# Patient Record
Sex: Male | Born: 1955 | Race: White | Hispanic: No | Marital: Married
Health system: Southern US, Community
[De-identification: ages and names within clinical notes are randomized; demographics above are authoritative.]

---

## 1999-05-11 ENCOUNTER — Other Ambulatory Visit: Admission: RE | Admit: 1999-05-11 | Discharge: 1999-05-11 | Payer: Self-pay | Admitting: Otolaryngology

## 1999-05-12 ENCOUNTER — Inpatient Hospital Stay (HOSPITAL_COMMUNITY): Admission: AD | Admit: 1999-05-12 | Discharge: 1999-05-14 | Payer: Self-pay | Admitting: Otolaryngology

## 2000-08-12 ENCOUNTER — Ambulatory Visit (HOSPITAL_COMMUNITY): Admission: RE | Admit: 2000-08-12 | Discharge: 2000-08-12 | Payer: Self-pay | Admitting: Gastroenterology

## 2005-04-19 ENCOUNTER — Ambulatory Visit (HOSPITAL_BASED_OUTPATIENT_CLINIC_OR_DEPARTMENT_OTHER): Admission: RE | Admit: 2005-04-19 | Discharge: 2005-04-19 | Payer: Self-pay | Admitting: General Surgery

## 2005-04-19 ENCOUNTER — Ambulatory Visit (HOSPITAL_COMMUNITY): Admission: RE | Admit: 2005-04-19 | Discharge: 2005-04-19 | Payer: Self-pay | Admitting: General Surgery

## 2012-02-10 ENCOUNTER — Ambulatory Visit: Payer: BC Managed Care – PPO | Attending: Orthopedic Surgery | Admitting: Physical Therapy

## 2012-02-10 DIAGNOSIS — M25519 Pain in unspecified shoulder: Secondary | ICD-10-CM | POA: Insufficient documentation

## 2012-02-10 DIAGNOSIS — IMO0001 Reserved for inherently not codable concepts without codable children: Secondary | ICD-10-CM | POA: Insufficient documentation

## 2012-02-10 DIAGNOSIS — R5381 Other malaise: Secondary | ICD-10-CM | POA: Insufficient documentation

## 2012-02-15 ENCOUNTER — Ambulatory Visit: Payer: BC Managed Care – PPO | Admitting: *Deleted

## 2012-02-18 ENCOUNTER — Ambulatory Visit: Payer: BC Managed Care – PPO | Admitting: Physical Therapy

## 2012-02-22 ENCOUNTER — Ambulatory Visit: Payer: BC Managed Care – PPO | Admitting: Physical Therapy

## 2012-02-25 ENCOUNTER — Ambulatory Visit: Payer: BC Managed Care – PPO | Admitting: Physical Therapy

## 2012-02-28 ENCOUNTER — Ambulatory Visit: Payer: BC Managed Care – PPO | Attending: Orthopedic Surgery | Admitting: Physical Therapy

## 2012-02-28 DIAGNOSIS — R5381 Other malaise: Secondary | ICD-10-CM | POA: Insufficient documentation

## 2012-02-28 DIAGNOSIS — IMO0001 Reserved for inherently not codable concepts without codable children: Secondary | ICD-10-CM | POA: Insufficient documentation

## 2012-02-28 DIAGNOSIS — M25519 Pain in unspecified shoulder: Secondary | ICD-10-CM | POA: Insufficient documentation

## 2012-03-01 ENCOUNTER — Ambulatory Visit: Payer: BC Managed Care – PPO | Admitting: Physical Therapy

## 2012-03-01 ENCOUNTER — Encounter: Payer: BC Managed Care – PPO | Admitting: Physical Therapy

## 2012-03-07 ENCOUNTER — Ambulatory Visit: Payer: BC Managed Care – PPO | Admitting: Physical Therapy

## 2012-03-10 ENCOUNTER — Ambulatory Visit: Payer: BC Managed Care – PPO | Admitting: Physical Therapy

## 2012-03-13 ENCOUNTER — Ambulatory Visit: Payer: BC Managed Care – PPO | Admitting: Physical Therapy

## 2012-03-17 ENCOUNTER — Ambulatory Visit: Payer: BC Managed Care – PPO | Admitting: *Deleted

## 2012-03-21 ENCOUNTER — Ambulatory Visit: Payer: BC Managed Care – PPO | Admitting: *Deleted

## 2012-03-24 ENCOUNTER — Ambulatory Visit: Payer: BC Managed Care – PPO | Admitting: Physical Therapy

## 2012-03-28 ENCOUNTER — Ambulatory Visit: Payer: BC Managed Care – PPO | Admitting: *Deleted

## 2012-03-31 ENCOUNTER — Ambulatory Visit: Payer: BC Managed Care – PPO | Attending: Orthopedic Surgery | Admitting: Physical Therapy

## 2012-03-31 DIAGNOSIS — IMO0001 Reserved for inherently not codable concepts without codable children: Secondary | ICD-10-CM | POA: Insufficient documentation

## 2012-03-31 DIAGNOSIS — M25519 Pain in unspecified shoulder: Secondary | ICD-10-CM | POA: Insufficient documentation

## 2012-03-31 DIAGNOSIS — R5381 Other malaise: Secondary | ICD-10-CM | POA: Insufficient documentation

## 2012-04-04 ENCOUNTER — Ambulatory Visit: Payer: BC Managed Care – PPO | Admitting: Physical Therapy

## 2012-04-07 ENCOUNTER — Ambulatory Visit: Payer: BC Managed Care – PPO | Admitting: Physical Therapy

## 2012-04-11 ENCOUNTER — Ambulatory Visit: Payer: BC Managed Care – PPO | Admitting: Physical Therapy

## 2012-04-13 ENCOUNTER — Encounter: Payer: BC Managed Care – PPO | Admitting: Physical Therapy

## 2012-04-18 ENCOUNTER — Ambulatory Visit: Payer: BC Managed Care – PPO | Admitting: *Deleted

## 2012-04-21 ENCOUNTER — Ambulatory Visit: Payer: BC Managed Care – PPO | Admitting: *Deleted

## 2012-04-25 ENCOUNTER — Ambulatory Visit: Payer: BC Managed Care – PPO | Admitting: *Deleted

## 2012-04-28 ENCOUNTER — Ambulatory Visit: Payer: BC Managed Care – PPO | Admitting: *Deleted

## 2015-10-15 ENCOUNTER — Ambulatory Visit
Admission: RE | Admit: 2015-10-15 | Discharge: 2015-10-15 | Disposition: A | Discharge status: Home / Self Care | Payer: BLUE CROSS/BLUE SHIELD | Source: Ambulatory Visit | Attending: Family Medicine | Admitting: Family Medicine

## 2015-10-15 ENCOUNTER — Other Ambulatory Visit: Payer: Self-pay | Admitting: Family Medicine

## 2015-10-15 DIAGNOSIS — T1490XA Injury, unspecified, initial encounter: Secondary | ICD-10-CM

## 2015-10-15 DIAGNOSIS — M79672 Pain in left foot: Secondary | ICD-10-CM

## 2015-12-02 DIAGNOSIS — J019 Acute sinusitis, unspecified: Secondary | ICD-10-CM | POA: Diagnosis not present

## 2015-12-24 DIAGNOSIS — J309 Allergic rhinitis, unspecified: Secondary | ICD-10-CM | POA: Diagnosis not present

## 2016-04-22 DIAGNOSIS — E669 Obesity, unspecified: Secondary | ICD-10-CM | POA: Diagnosis not present

## 2016-04-22 DIAGNOSIS — J329 Chronic sinusitis, unspecified: Secondary | ICD-10-CM | POA: Diagnosis not present

## 2016-04-22 DIAGNOSIS — I10 Essential (primary) hypertension: Secondary | ICD-10-CM | POA: Diagnosis not present

## 2016-12-14 DIAGNOSIS — I10 Essential (primary) hypertension: Secondary | ICD-10-CM | POA: Diagnosis not present

## 2016-12-14 DIAGNOSIS — Z0000 Encounter for general adult medical examination without abnormal findings: Secondary | ICD-10-CM | POA: Diagnosis not present

## 2016-12-14 DIAGNOSIS — E669 Obesity, unspecified: Secondary | ICD-10-CM | POA: Diagnosis not present

## 2016-12-14 DIAGNOSIS — R748 Abnormal levels of other serum enzymes: Secondary | ICD-10-CM | POA: Diagnosis not present

## 2016-12-14 DIAGNOSIS — Z125 Encounter for screening for malignant neoplasm of prostate: Secondary | ICD-10-CM | POA: Diagnosis not present

## 2017-01-19 DIAGNOSIS — Z1211 Encounter for screening for malignant neoplasm of colon: Secondary | ICD-10-CM | POA: Diagnosis not present

## 2017-01-19 DIAGNOSIS — Z8371 Family history of colonic polyps: Secondary | ICD-10-CM | POA: Diagnosis not present

## 2017-01-19 DIAGNOSIS — Z800 Family history of malignant neoplasm of digestive organs: Secondary | ICD-10-CM | POA: Diagnosis not present

## 2017-01-28 DIAGNOSIS — N401 Enlarged prostate with lower urinary tract symptoms: Secondary | ICD-10-CM | POA: Diagnosis not present

## 2017-01-28 DIAGNOSIS — R351 Nocturia: Secondary | ICD-10-CM | POA: Diagnosis not present

## 2017-01-28 DIAGNOSIS — N5201 Erectile dysfunction due to arterial insufficiency: Secondary | ICD-10-CM | POA: Diagnosis not present

## 2017-02-09 DIAGNOSIS — N5201 Erectile dysfunction due to arterial insufficiency: Secondary | ICD-10-CM | POA: Diagnosis not present

## 2017-03-21 ENCOUNTER — Other Ambulatory Visit: Payer: Self-pay | Admitting: Family Medicine

## 2017-06-15 DIAGNOSIS — I10 Essential (primary) hypertension: Secondary | ICD-10-CM | POA: Diagnosis not present

## 2017-06-15 DIAGNOSIS — Z23 Encounter for immunization: Secondary | ICD-10-CM | POA: Diagnosis not present

## 2017-06-15 DIAGNOSIS — E669 Obesity, unspecified: Secondary | ICD-10-CM | POA: Diagnosis not present

## 2017-06-15 DIAGNOSIS — K219 Gastro-esophageal reflux disease without esophagitis: Secondary | ICD-10-CM | POA: Diagnosis not present

## 2017-07-28 DIAGNOSIS — H524 Presbyopia: Secondary | ICD-10-CM | POA: Diagnosis not present

## 2017-07-28 DIAGNOSIS — H52223 Regular astigmatism, bilateral: Secondary | ICD-10-CM | POA: Diagnosis not present

## 2017-07-28 DIAGNOSIS — H5203 Hypermetropia, bilateral: Secondary | ICD-10-CM | POA: Diagnosis not present

## 2018-01-10 DIAGNOSIS — Z125 Encounter for screening for malignant neoplasm of prostate: Secondary | ICD-10-CM | POA: Diagnosis not present

## 2018-01-10 DIAGNOSIS — Z0000 Encounter for general adult medical examination without abnormal findings: Secondary | ICD-10-CM | POA: Diagnosis not present

## 2018-01-10 DIAGNOSIS — I10 Essential (primary) hypertension: Secondary | ICD-10-CM | POA: Diagnosis not present

## 2018-01-19 DIAGNOSIS — M25519 Pain in unspecified shoulder: Secondary | ICD-10-CM | POA: Diagnosis not present

## 2018-01-19 DIAGNOSIS — M25512 Pain in left shoulder: Secondary | ICD-10-CM | POA: Diagnosis not present

## 2018-02-02 DIAGNOSIS — M25512 Pain in left shoulder: Secondary | ICD-10-CM | POA: Diagnosis not present

## 2018-02-02 DIAGNOSIS — M25519 Pain in unspecified shoulder: Secondary | ICD-10-CM | POA: Diagnosis not present

## 2018-08-07 DIAGNOSIS — Z23 Encounter for immunization: Secondary | ICD-10-CM | POA: Diagnosis not present

## 2018-08-15 ENCOUNTER — Ambulatory Visit (INDEPENDENT_AMBULATORY_CARE_PROVIDER_SITE_OTHER): Payer: Self-pay

## 2018-08-15 ENCOUNTER — Encounter (INDEPENDENT_AMBULATORY_CARE_PROVIDER_SITE_OTHER): Payer: Self-pay | Admitting: Orthopaedic Surgery

## 2018-08-15 ENCOUNTER — Ambulatory Visit (INDEPENDENT_AMBULATORY_CARE_PROVIDER_SITE_OTHER): Payer: BLUE CROSS/BLUE SHIELD | Admitting: Orthopaedic Surgery

## 2018-08-15 DIAGNOSIS — G8929 Other chronic pain: Secondary | ICD-10-CM | POA: Diagnosis not present

## 2018-08-15 DIAGNOSIS — M7542 Impingement syndrome of left shoulder: Secondary | ICD-10-CM | POA: Diagnosis not present

## 2018-08-15 DIAGNOSIS — M25512 Pain in left shoulder: Secondary | ICD-10-CM | POA: Diagnosis not present

## 2018-08-15 MED ORDER — METHYLPREDNISOLONE ACETATE 40 MG/ML IJ SUSP
40.0000 mg | INTRAMUSCULAR | Status: AC | PRN
  Administered 2018-08-15: 40 mg via INTRA_ARTICULAR

## 2018-08-15 MED ORDER — LIDOCAINE HCL 1 % IJ SOLN
3.0000 mL | INTRAMUSCULAR | Status: AC | PRN
  Administered 2018-08-15: 3 mL
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
## 2018-09-06 DIAGNOSIS — H25812 Combined forms of age-related cataract, left eye: Secondary | ICD-10-CM | POA: Diagnosis not present

## 2018-09-06 DIAGNOSIS — H524 Presbyopia: Secondary | ICD-10-CM | POA: Diagnosis not present

## 2018-09-06 DIAGNOSIS — H5203 Hypermetropia, bilateral: Secondary | ICD-10-CM | POA: Diagnosis not present

## 2018-09-06 DIAGNOSIS — H52223 Regular astigmatism, bilateral: Secondary | ICD-10-CM | POA: Diagnosis not present

## 2018-09-26 ENCOUNTER — Other Ambulatory Visit (INDEPENDENT_AMBULATORY_CARE_PROVIDER_SITE_OTHER): Payer: Self-pay

## 2018-09-26 ENCOUNTER — Encounter (INDEPENDENT_AMBULATORY_CARE_PROVIDER_SITE_OTHER): Payer: Self-pay | Admitting: Orthopaedic Surgery

## 2018-09-26 ENCOUNTER — Ambulatory Visit (INDEPENDENT_AMBULATORY_CARE_PROVIDER_SITE_OTHER): Payer: BLUE CROSS/BLUE SHIELD | Admitting: Orthopaedic Surgery

## 2018-09-26 DIAGNOSIS — M7542 Impingement syndrome of left shoulder: Secondary | ICD-10-CM

## 2018-09-26 DIAGNOSIS — G8929 Other chronic pain: Secondary | ICD-10-CM

## 2018-09-26 DIAGNOSIS — M25512 Pain in left shoulder: Principal | ICD-10-CM

## 2018-09-26 MED ORDER — METHYLPREDNISOLONE ACETATE 40 MG/ML IJ SUSP
40.0000 mg | INTRAMUSCULAR | Status: AC | PRN
  Administered 2018-09-26: 40 mg via INTRA_ARTICULAR

## 2018-09-26 MED ORDER — LIDOCAINE HCL 1 % IJ SOLN
3.0000 mL | INTRAMUSCULAR | Status: AC | PRN
  Administered 2018-09-26: 3 mL

## 2018-09-26 NOTE — Progress Notes (Signed)
Office Visit Note   Patient: Roderick Calo           Date of Birth: 06-24-1956           MRN: [id number] Visit Date: 09/26/2018              Requested by: Mayra Neer, MD [address] [place], [place] [address] PCP: Mayra Neer, MD   Assessment & Plan: Visit Diagnoses:  1. Chronic left shoulder pain   2. Impingement syndrome of left shoulder     Plan: I did provide a second steroid injection now in the subacromial space of the left shoulder.  I do feel an MRI is now clinically and medically warranted given his worsening left shoulder pain and weakness.  This will be to rule out a rotator cuff tear.  All question concerns were answered and addressed.  We will see him back in 2 weeks to hopefully go over that MRI.  Follow-Up Instructions: Return in about 2 weeks (around 10/10/2018).   Orders:  Orders Placed This Encounter  Procedures  . Large Joint Inj   No orders of the defined types were placed in this encounter.     Procedures: Large Joint Inj: L subacromial bursa on 09/26/2018 9:02 AM Indications: pain and diagnostic evaluation Details: 22 G 1.5 in needle  Arthrogram: No  Medications: 3 mL lidocaine 1 %; 40 mg methylPREDNISolone acetate 40 MG/ML Outcome: tolerated well, no immediate complications Procedure, treatment alternatives, risks and benefits explained, specific risks discussed. Consent was given by the patient. Immediately prior to procedure a time out was called to verify the correct patient, procedure, equipment, support staff and site/side marked as required. Patient was prepped and draped in the usual sterile fashion.       Clinical Data: No additional findings.   Subjective: Chief Complaint  Patient presents with  . Left Shoulder - Follow-up  The patient comes in today with worsening left shoulder pain and weakness.  We provided least 1 steroid injection in the subacromial space.  He is worked on activity modification and shoulder  strengthening and home exercise program.  He is taken anti-inflammatories as well.  He is right-hand dominant but does a lot with his left shoulder.  He is not injured this before.  I have seen him for shoulder before now again his pain is getting worse.  It hurts with overhead activities and reaching behind him and there is some weakness in his shoulder as well.  Denies any numbness and tingling in his left upper extremity and denies any neck pain.  HPI  Review of Systems He currently denies any headache, chest pain, shortness of breath, fever, chills, nausea, vomiting.  Objective: Vital Signs: There were no vitals taken for this visit.  Physical Exam He is alert and orient x3 and in no acute distress Ortho Exam Examination of his left shoulder shows positive Neer and Hawkins sign.  He has painful abduction as well as internal rotation with adduction.  There is some weakness with external rotation. Specialty Comments:  No specialty comments available.  Imaging: No results found.   PMFS History: There are no active problems to display for this patient.  History reviewed. No pertinent past medical history.  History reviewed. No pertinent family history.  History reviewed. No pertinent surgical history. Social History   Occupational History  . Not on file  Tobacco Use  . Smoking status: Not on file  Substance and Sexual Activity  . Alcohol use:  Not on file  . Drug use: Not on file  . Sexual activity: Not on file

## 2018-10-10 ENCOUNTER — Ambulatory Visit
Admission: RE | Admit: 2018-10-10 | Discharge: 2018-10-10 | Disposition: A | Discharge status: Home / Self Care | Payer: BLUE CROSS/BLUE SHIELD | Source: Ambulatory Visit | Attending: Orthopaedic Surgery | Admitting: Orthopaedic Surgery

## 2018-10-10 ENCOUNTER — Encounter (INDEPENDENT_AMBULATORY_CARE_PROVIDER_SITE_OTHER): Payer: Self-pay | Admitting: Orthopaedic Surgery

## 2018-10-10 ENCOUNTER — Ambulatory Visit (INDEPENDENT_AMBULATORY_CARE_PROVIDER_SITE_OTHER): Payer: BLUE CROSS/BLUE SHIELD | Admitting: Orthopaedic Surgery

## 2018-10-10 DIAGNOSIS — G8929 Other chronic pain: Secondary | ICD-10-CM

## 2018-10-10 DIAGNOSIS — M19012 Primary osteoarthritis, left shoulder: Secondary | ICD-10-CM | POA: Diagnosis not present

## 2018-10-10 DIAGNOSIS — M25512 Pain in left shoulder: Principal | ICD-10-CM

## 2018-10-10 DIAGNOSIS — M7542 Impingement syndrome of left shoulder: Secondary | ICD-10-CM

## 2018-10-10 NOTE — Progress Notes (Signed)
The patient comes in today to go over an MRI of his left shoulder.  However he just had the MRI this morning and the radiologist have not had a chance to look at it.  He did say that the injection I provided most recently has done great for him.  The first injection had no help at all.  He says the injection we did recently is at least help things calm down but he still has some shoulder pain.  On exam there is no weakness in the shoulder but does her past 90 degrees of abduction.  He does have positive Neer and Hawkins signs well.  I did look at the MRI myself without the rate and there is definite arthritic changes of the [facility] joint.  There does appear to be chronic tears of the rotator cuff but I cannot tell how significant this may be and we await on the radiologist read.  From my standpoint we will see him back in a week to see how is doing overall.  I will not charge him for a physician fever the visit today at all since I do not have the results of the MRI so we can make it better judgment as to what surgery he may need if at all.  He understands this as well and is happy to come back and see [place] in a week.

## 2018-10-17 ENCOUNTER — Ambulatory Visit (INDEPENDENT_AMBULATORY_CARE_PROVIDER_SITE_OTHER): Payer: BLUE CROSS/BLUE SHIELD | Admitting: Orthopaedic Surgery

## 2018-10-17 ENCOUNTER — Encounter (INDEPENDENT_AMBULATORY_CARE_PROVIDER_SITE_OTHER): Payer: Self-pay | Admitting: Orthopaedic Surgery

## 2018-10-17 DIAGNOSIS — M7542 Impingement syndrome of left shoulder: Secondary | ICD-10-CM | POA: Diagnosis not present

## 2018-10-17 NOTE — Progress Notes (Signed)
The patient is here today to go over chronic left shoulder issues.  We have provided at least 2 steroid injections in the subacromial space of the shoulder based on a diagnosis of severe impingement syndrome.  He said the last injection we did just 2 weeks ago has helped quite a bit.  We still sent him for an MRI to get a good idea of what is going on with the shoulder on the left side given his continued pain.  On exam he still show signs of impingement syndrome but he tolerates is better.  His range of motion is improved of his left shoulder.  The MRI does show severe tendinosis of the rotator cuff with no tear.  There is a type II acromion and moderate osteoarthritis of the AC joint.  There is also some biceps tendinosis and tendinitis.  Again there is no tear.  There is no significant glenohumeral disease.  I shared with him his MRI findings and showed him a shoulder model explained what this means.  He wants to try at least another month or 2 of conservative treatment I agree with this.  I would at least consider 1 more steroid injection in April of this year.  He will see [place] then.  All question concerns were answered and addressed.

## 2018-12-27 ENCOUNTER — Ambulatory Visit (INDEPENDENT_AMBULATORY_CARE_PROVIDER_SITE_OTHER): Payer: BLUE CROSS/BLUE SHIELD | Admitting: Orthopaedic Surgery

## 2019-02-01 DIAGNOSIS — I10 Essential (primary) hypertension: Secondary | ICD-10-CM | POA: Diagnosis not present

## 2019-02-01 DIAGNOSIS — Z0000 Encounter for general adult medical examination without abnormal findings: Secondary | ICD-10-CM | POA: Diagnosis not present

## 2019-02-01 DIAGNOSIS — Z23 Encounter for immunization: Secondary | ICD-10-CM | POA: Diagnosis not present

## 2019-02-01 DIAGNOSIS — Z125 Encounter for screening for malignant neoplasm of prostate: Secondary | ICD-10-CM | POA: Diagnosis not present

## 2019-02-15 DIAGNOSIS — R7301 Impaired fasting glucose: Secondary | ICD-10-CM | POA: Diagnosis not present

## 2019-08-07 DIAGNOSIS — R7301 Impaired fasting glucose: Secondary | ICD-10-CM | POA: Diagnosis not present

## 2019-08-07 DIAGNOSIS — I10 Essential (primary) hypertension: Secondary | ICD-10-CM | POA: Diagnosis not present

## 2019-08-09 DIAGNOSIS — E669 Obesity, unspecified: Secondary | ICD-10-CM | POA: Diagnosis not present

## 2019-08-09 DIAGNOSIS — R7301 Impaired fasting glucose: Secondary | ICD-10-CM | POA: Diagnosis not present

## 2019-08-09 DIAGNOSIS — I10 Essential (primary) hypertension: Secondary | ICD-10-CM | POA: Diagnosis not present

## 2019-10-01 IMAGING — MR MR SHOULDER*L* W/O CM
4 of 5 series · 21 of 40 positions shown · non-contrast
Comparison: MRI left shoulder 04/04/2012.

CLINICAL DATA: Left shoulder pain for 2 years.  No recent injury.

EXAM:
MRI OF THE LEFT SHOULDER WITHOUT CONTRAST
TECHNIQUE: Multiplanar, multisequence MR imaging of the shoulder was performed.
No intravenous contrast was administered.

[Series 6: PD fat-sat · axial · left · 4.0mm · 0.44mm/px · z∈[-13,+95]mm · 8 of 24 slices shown (1 of 2)]
[im 1/24]
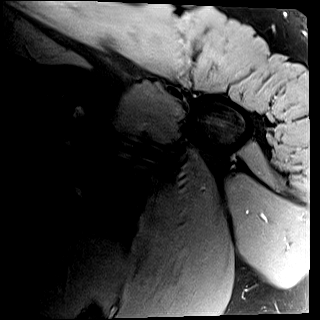
[im 4/24]
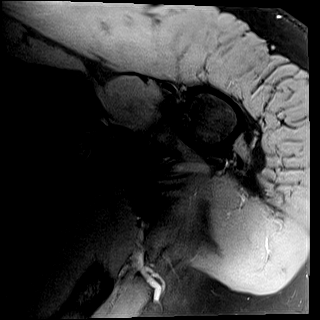
[im 7/24]
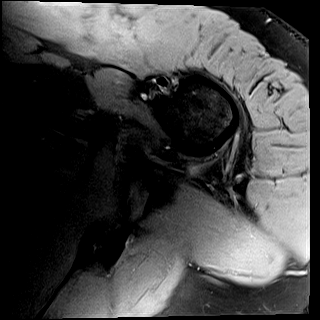
[im 10/24]
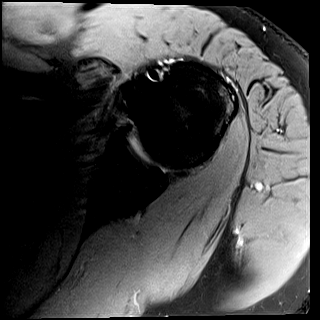
[im 14/24]
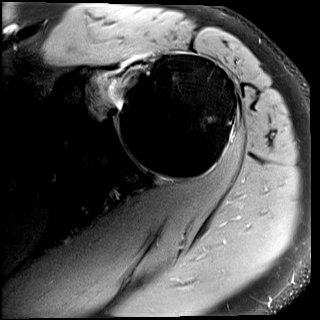
[im 17/24]
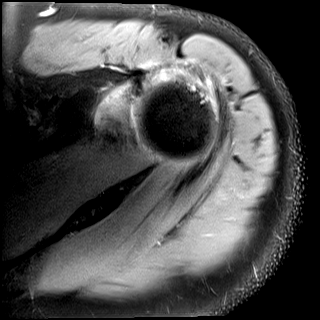
[im 20/24]
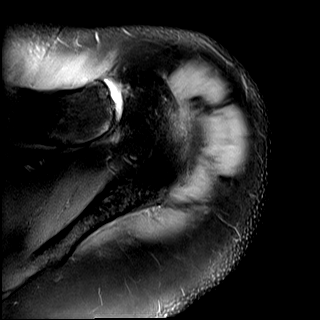
[im 24/24]
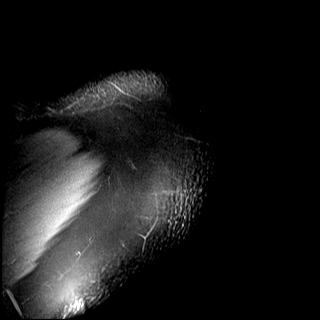

[Series 7: T2 fat-sat · oblique · left · 4.0mm · 0.22mm/px · 3 of 23 slices shown (1 of 2)]
[im 4/23]
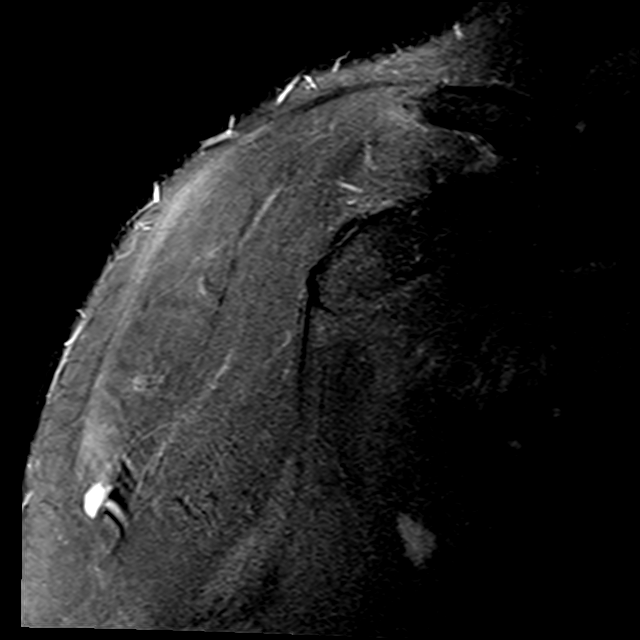
[im 13/23]
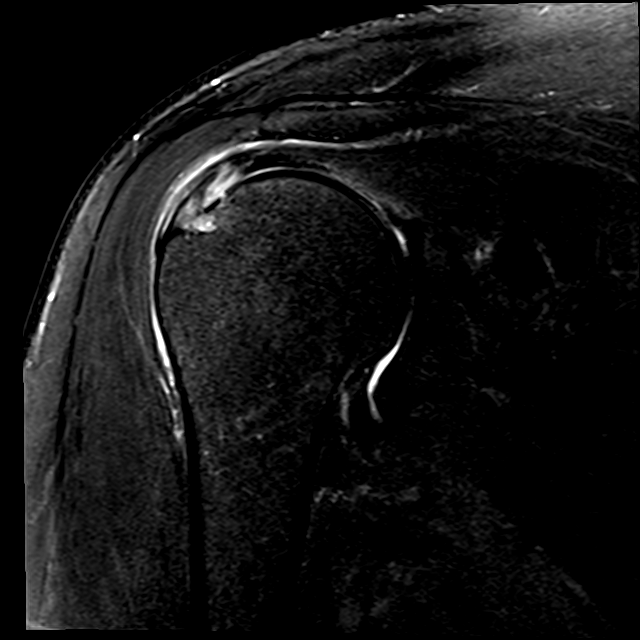
[im 19/23]
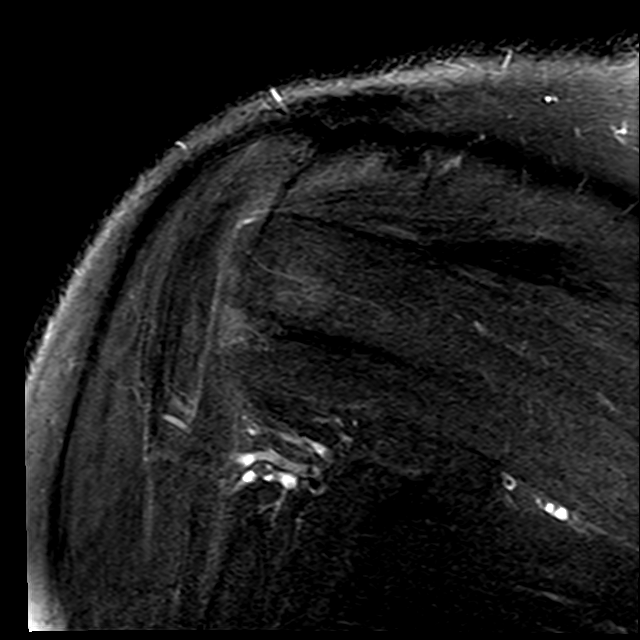

[Series 8: PD fat-sat · oblique · left · 4.0mm · 0.22mm/px · 7 of 23 slices shown (2 of 2)]
[im 1/23]
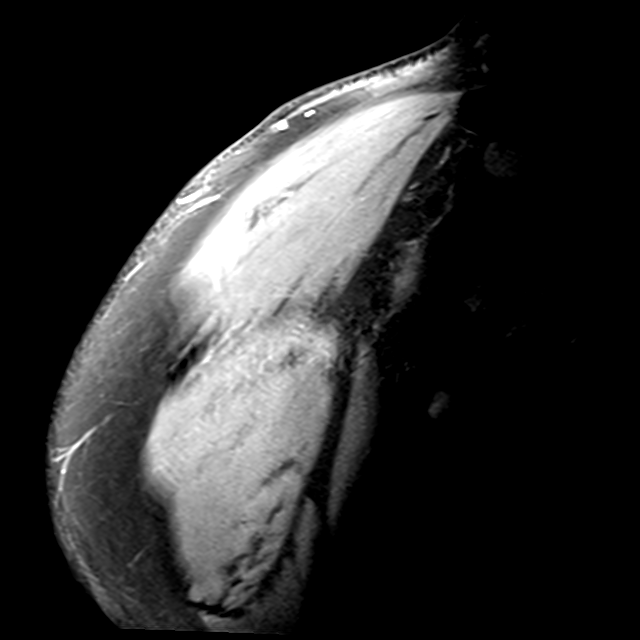
[im 4/23]
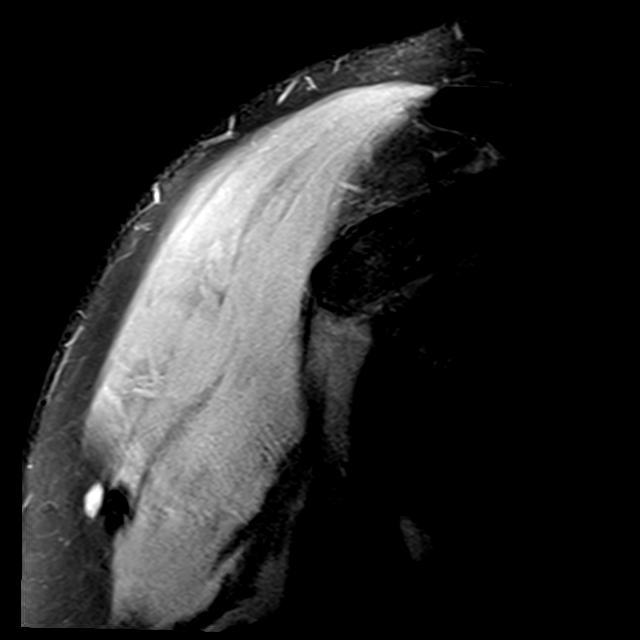
[im 7/23]
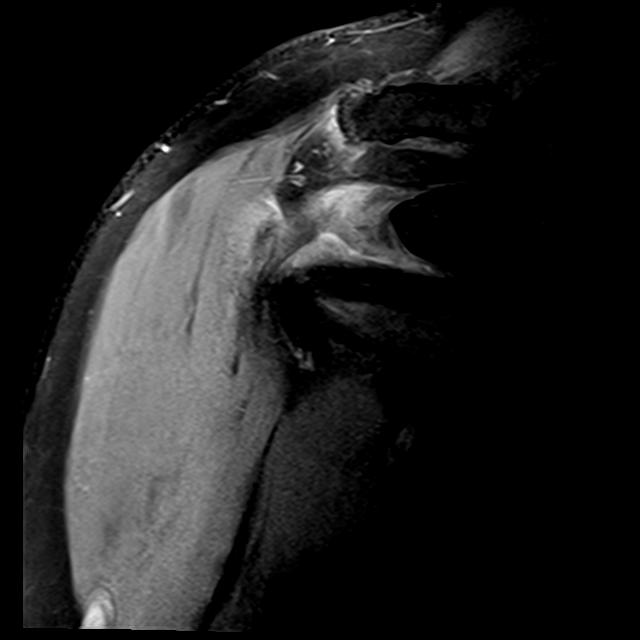
[im 10/23]
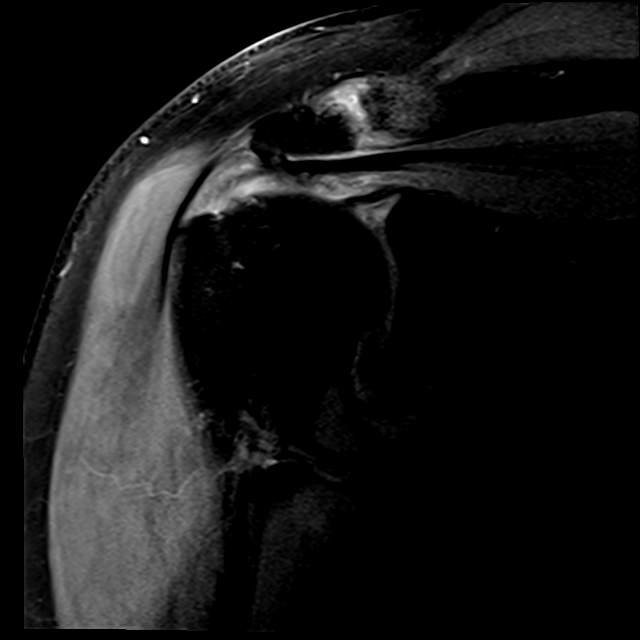
[im 13/23]
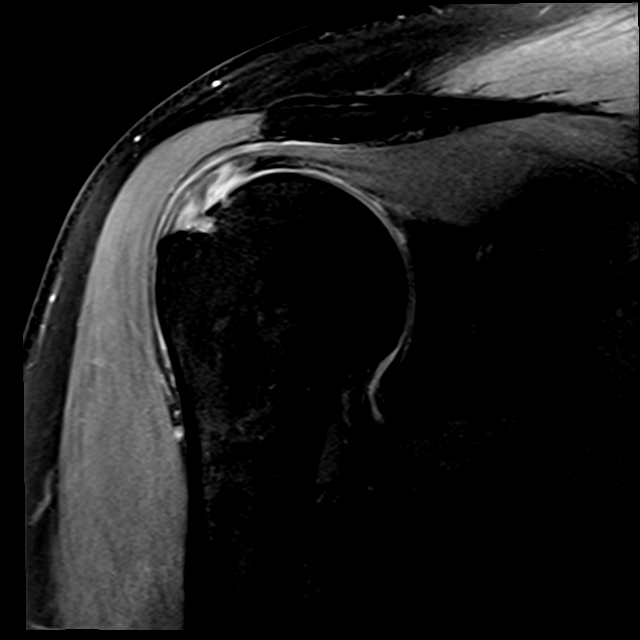
[im 16/23]
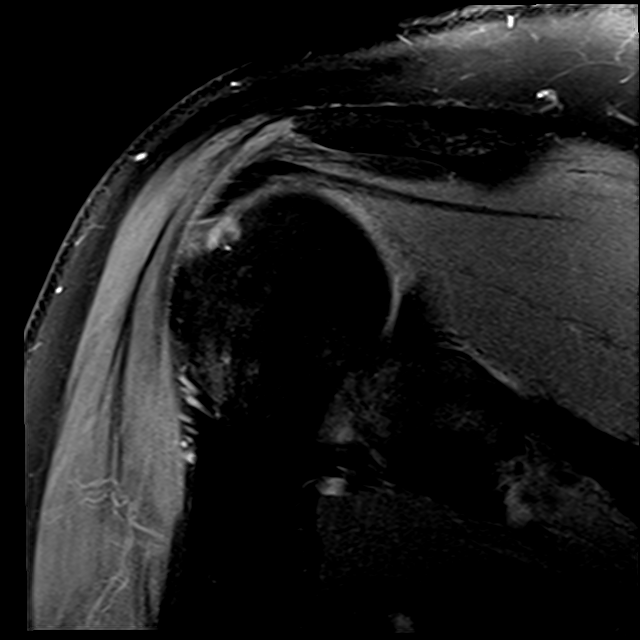
[im 19/23]
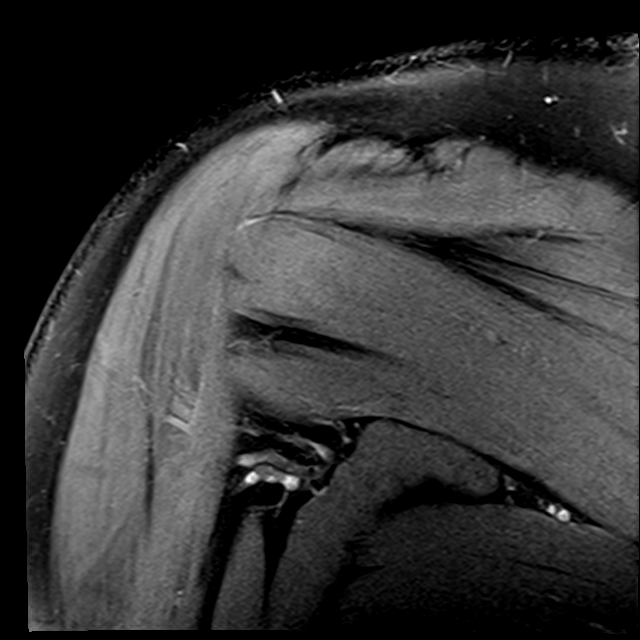

[Series 9: T2 fat-sat · coronal · left · 4.0mm · 0.44mm/px · 3 of 23 slices shown (2 of 2)]
[im 4/23]
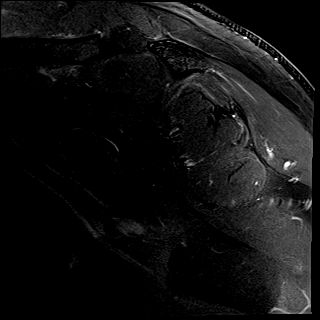
[im 13/23]
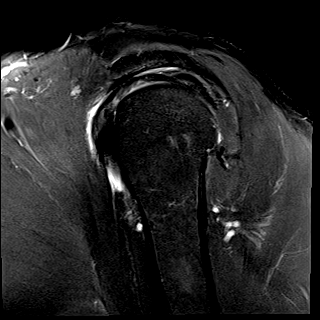
[im 19/23]
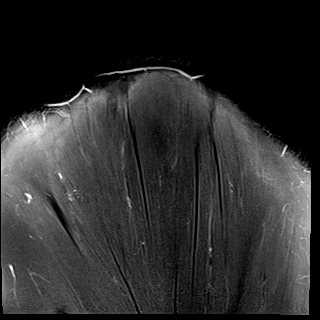

[21 of 40 positions shown; findings below may reference images not displayed]

FINDINGS: Rotator cuff: There is heterogeneously increased T2 signal in the
rotator cuff tendons consistent with tendinopathy. Changes are worst
in the supraspinatus and have progressed since the prior MRI. No
tear.

Muscles:  No atrophy or focal lesion.

Biceps long head: Intrasubstance increased T2 signal in the tendon
is seen in the bicipital interval compatible with tendinosis. No
tear.

Acromioclavicular Joint: Mild-to-moderate osteoarthritis is present.
Type 2 acromion. There is some fluid in the subacromial/subdeltoid
bursa.

Glenohumeral Joint: Unremarkable.

Labrum:  Intact.

Bones:  No fracture or worrisome lesion.

Other: None.
IMPRESSION: Rotator cuff and intra-articular long head of biceps tendinopathy
without tear.

Subacromial/subdeltoid bursitis.

Mild to moderate acromioclavicular osteoarthritis.

## 2019-11-21 DIAGNOSIS — Z23 Encounter for immunization: Secondary | ICD-10-CM | POA: Diagnosis not present

## 2019-12-19 DIAGNOSIS — Z23 Encounter for immunization: Secondary | ICD-10-CM | POA: Diagnosis not present

## 2020-02-06 DIAGNOSIS — Z125 Encounter for screening for malignant neoplasm of prostate: Secondary | ICD-10-CM | POA: Diagnosis not present

## 2020-02-06 DIAGNOSIS — Z0000 Encounter for general adult medical examination without abnormal findings: Secondary | ICD-10-CM | POA: Diagnosis not present

## 2020-02-06 DIAGNOSIS — I10 Essential (primary) hypertension: Secondary | ICD-10-CM | POA: Diagnosis not present

## 2020-08-08 DIAGNOSIS — I10 Essential (primary) hypertension: Secondary | ICD-10-CM | POA: Diagnosis not present

## 2020-08-16 DIAGNOSIS — Z20822 Contact with and (suspected) exposure to covid-19: Secondary | ICD-10-CM | POA: Diagnosis not present

## 2020-08-21 DIAGNOSIS — R300 Dysuria: Secondary | ICD-10-CM | POA: Diagnosis not present

## 2020-08-29 DIAGNOSIS — R110 Nausea: Secondary | ICD-10-CM | POA: Diagnosis not present

## 2020-08-29 DIAGNOSIS — R21 Rash and other nonspecific skin eruption: Secondary | ICD-10-CM | POA: Diagnosis not present

## 2020-08-29 DIAGNOSIS — J988 Other specified respiratory disorders: Secondary | ICD-10-CM | POA: Diagnosis not present

## 2020-09-11 DIAGNOSIS — N179 Acute kidney failure, unspecified: Secondary | ICD-10-CM | POA: Diagnosis not present

## 2020-12-09 DIAGNOSIS — H5203 Hypermetropia, bilateral: Secondary | ICD-10-CM | POA: Diagnosis not present

## 2020-12-09 DIAGNOSIS — H52223 Regular astigmatism, bilateral: Secondary | ICD-10-CM | POA: Diagnosis not present

## 2020-12-09 DIAGNOSIS — H524 Presbyopia: Secondary | ICD-10-CM | POA: Diagnosis not present

## 2020-12-09 DIAGNOSIS — H25813 Combined forms of age-related cataract, bilateral: Secondary | ICD-10-CM | POA: Diagnosis not present

## 2021-02-18 DIAGNOSIS — I10 Essential (primary) hypertension: Secondary | ICD-10-CM | POA: Diagnosis not present

## 2021-02-18 DIAGNOSIS — L859 Epidermal thickening, unspecified: Secondary | ICD-10-CM | POA: Diagnosis not present

## 2021-02-18 DIAGNOSIS — L989 Disorder of the skin and subcutaneous tissue, unspecified: Secondary | ICD-10-CM | POA: Diagnosis not present

## 2021-02-18 DIAGNOSIS — Z0000 Encounter for general adult medical examination without abnormal findings: Secondary | ICD-10-CM | POA: Diagnosis not present

## 2021-02-18 DIAGNOSIS — Z125 Encounter for screening for malignant neoplasm of prostate: Secondary | ICD-10-CM | POA: Diagnosis not present

## 2022-02-24 ENCOUNTER — Other Ambulatory Visit: Payer: Self-pay | Admitting: Family Medicine

## 2022-02-24 DIAGNOSIS — D485 Neoplasm of uncertain behavior of skin: Secondary | ICD-10-CM | POA: Diagnosis not present

## 2022-02-24 DIAGNOSIS — Z1159 Encounter for screening for other viral diseases: Secondary | ICD-10-CM | POA: Diagnosis not present

## 2022-02-24 DIAGNOSIS — J309 Allergic rhinitis, unspecified: Secondary | ICD-10-CM | POA: Diagnosis not present

## 2022-02-24 DIAGNOSIS — Z720 Tobacco use: Secondary | ICD-10-CM | POA: Diagnosis not present

## 2022-02-24 DIAGNOSIS — I10 Essential (primary) hypertension: Secondary | ICD-10-CM | POA: Diagnosis not present

## 2022-02-24 DIAGNOSIS — Z87891 Personal history of nicotine dependence: Secondary | ICD-10-CM

## 2022-02-24 DIAGNOSIS — L989 Disorder of the skin and subcutaneous tissue, unspecified: Secondary | ICD-10-CM | POA: Diagnosis not present

## 2022-02-24 DIAGNOSIS — Z1211 Encounter for screening for malignant neoplasm of colon: Secondary | ICD-10-CM | POA: Diagnosis not present

## 2022-02-24 DIAGNOSIS — Z0000 Encounter for general adult medical examination without abnormal findings: Secondary | ICD-10-CM | POA: Diagnosis not present

## 2022-02-24 DIAGNOSIS — Z23 Encounter for immunization: Secondary | ICD-10-CM | POA: Diagnosis not present

## 2022-03-05 ENCOUNTER — Ambulatory Visit
Admission: RE | Admit: 2022-03-05 | Discharge: 2022-03-05 | Disposition: A | Discharge status: Home / Self Care | Payer: Medicare Other | Source: Ambulatory Visit | Attending: Family Medicine | Admitting: Family Medicine

## 2022-03-05 DIAGNOSIS — Z136 Encounter for screening for cardiovascular disorders: Secondary | ICD-10-CM | POA: Diagnosis not present

## 2022-03-05 DIAGNOSIS — Z87891 Personal history of nicotine dependence: Secondary | ICD-10-CM | POA: Diagnosis not present

## 2022-03-18 DIAGNOSIS — Z85828 Personal history of other malignant neoplasm of skin: Secondary | ICD-10-CM | POA: Diagnosis not present

## 2022-03-18 DIAGNOSIS — S80861A Insect bite (nonvenomous), right lower leg, initial encounter: Secondary | ICD-10-CM | POA: Diagnosis not present

## 2022-03-18 DIAGNOSIS — L814 Other melanin hyperpigmentation: Secondary | ICD-10-CM | POA: Diagnosis not present

## 2022-03-18 DIAGNOSIS — D0461 Carcinoma in situ of skin of right upper limb, including shoulder: Secondary | ICD-10-CM | POA: Diagnosis not present

## 2022-08-06 DIAGNOSIS — Z85828 Personal history of other malignant neoplasm of skin: Secondary | ICD-10-CM | POA: Diagnosis not present

## 2022-08-06 DIAGNOSIS — D485 Neoplasm of uncertain behavior of skin: Secondary | ICD-10-CM | POA: Diagnosis not present

## 2022-08-06 DIAGNOSIS — D2261 Melanocytic nevi of right upper limb, including shoulder: Secondary | ICD-10-CM | POA: Diagnosis not present

## 2022-08-06 DIAGNOSIS — L570 Actinic keratosis: Secondary | ICD-10-CM | POA: Diagnosis not present

## 2022-08-09 DIAGNOSIS — Z800 Family history of malignant neoplasm of digestive organs: Secondary | ICD-10-CM | POA: Diagnosis not present

## 2022-08-09 DIAGNOSIS — K649 Unspecified hemorrhoids: Secondary | ICD-10-CM | POA: Diagnosis not present

## 2022-08-09 DIAGNOSIS — K5730 Diverticulosis of large intestine without perforation or abscess without bleeding: Secondary | ICD-10-CM | POA: Diagnosis not present

## 2022-08-09 DIAGNOSIS — Z1211 Encounter for screening for malignant neoplasm of colon: Secondary | ICD-10-CM | POA: Diagnosis not present

## 2022-08-09 DIAGNOSIS — Z83719 Family history of colon polyps, unspecified: Secondary | ICD-10-CM | POA: Diagnosis not present

## 2023-03-02 DIAGNOSIS — J309 Allergic rhinitis, unspecified: Secondary | ICD-10-CM | POA: Diagnosis not present

## 2023-03-02 DIAGNOSIS — Z0000 Encounter for general adult medical examination without abnormal findings: Secondary | ICD-10-CM | POA: Diagnosis not present

## 2023-03-02 DIAGNOSIS — J019 Acute sinusitis, unspecified: Secondary | ICD-10-CM | POA: Diagnosis not present

## 2023-03-02 DIAGNOSIS — I10 Essential (primary) hypertension: Secondary | ICD-10-CM | POA: Diagnosis not present

## 2023-03-02 DIAGNOSIS — R7301 Impaired fasting glucose: Secondary | ICD-10-CM | POA: Diagnosis not present

## 2023-06-21 DIAGNOSIS — G4733 Obstructive sleep apnea (adult) (pediatric): Secondary | ICD-10-CM | POA: Diagnosis not present

## 2023-06-21 DIAGNOSIS — Z23 Encounter for immunization: Secondary | ICD-10-CM | POA: Diagnosis not present

## 2023-07-11 DIAGNOSIS — R0683 Snoring: Secondary | ICD-10-CM | POA: Diagnosis not present

## 2023-07-15 DIAGNOSIS — D225 Melanocytic nevi of trunk: Secondary | ICD-10-CM | POA: Diagnosis not present

## 2023-07-15 DIAGNOSIS — L570 Actinic keratosis: Secondary | ICD-10-CM | POA: Diagnosis not present

## 2023-07-15 DIAGNOSIS — L814 Other melanin hyperpigmentation: Secondary | ICD-10-CM | POA: Diagnosis not present

## 2023-07-15 DIAGNOSIS — Z85828 Personal history of other malignant neoplasm of skin: Secondary | ICD-10-CM | POA: Diagnosis not present

## 2023-07-15 DIAGNOSIS — L821 Other seborrheic keratosis: Secondary | ICD-10-CM | POA: Diagnosis not present

## 2023-07-15 DIAGNOSIS — C44321 Squamous cell carcinoma of skin of nose: Secondary | ICD-10-CM | POA: Diagnosis not present

## 2023-07-15 DIAGNOSIS — C44629 Squamous cell carcinoma of skin of left upper limb, including shoulder: Secondary | ICD-10-CM | POA: Diagnosis not present

## 2023-08-02 DIAGNOSIS — G4733 Obstructive sleep apnea (adult) (pediatric): Secondary | ICD-10-CM | POA: Diagnosis not present

## 2023-08-29 DIAGNOSIS — C44321 Squamous cell carcinoma of skin of nose: Secondary | ICD-10-CM | POA: Diagnosis not present

## 2023-09-06 DIAGNOSIS — G4733 Obstructive sleep apnea (adult) (pediatric): Secondary | ICD-10-CM | POA: Diagnosis not present

## 2023-09-15 DIAGNOSIS — G4733 Obstructive sleep apnea (adult) (pediatric): Secondary | ICD-10-CM | POA: Diagnosis not present

## 2023-10-16 DIAGNOSIS — G4733 Obstructive sleep apnea (adult) (pediatric): Secondary | ICD-10-CM | POA: Diagnosis not present

## 2023-11-13 DIAGNOSIS — G4733 Obstructive sleep apnea (adult) (pediatric): Secondary | ICD-10-CM | POA: Diagnosis not present

## 2023-11-15 DIAGNOSIS — G4733 Obstructive sleep apnea (adult) (pediatric): Secondary | ICD-10-CM | POA: Diagnosis not present

## 2023-11-16 DIAGNOSIS — G4733 Obstructive sleep apnea (adult) (pediatric): Secondary | ICD-10-CM | POA: Diagnosis not present

## 2024-02-06 DIAGNOSIS — M9903 Segmental and somatic dysfunction of lumbar region: Secondary | ICD-10-CM | POA: Diagnosis not present

## 2024-02-06 DIAGNOSIS — M9905 Segmental and somatic dysfunction of pelvic region: Secondary | ICD-10-CM | POA: Diagnosis not present

## 2024-02-06 DIAGNOSIS — M62830 Muscle spasm of back: Secondary | ICD-10-CM | POA: Diagnosis not present

## 2024-02-06 DIAGNOSIS — M9904 Segmental and somatic dysfunction of sacral region: Secondary | ICD-10-CM | POA: Diagnosis not present

## 2024-02-08 DIAGNOSIS — M9905 Segmental and somatic dysfunction of pelvic region: Secondary | ICD-10-CM | POA: Diagnosis not present

## 2024-02-08 DIAGNOSIS — M9904 Segmental and somatic dysfunction of sacral region: Secondary | ICD-10-CM | POA: Diagnosis not present

## 2024-02-08 DIAGNOSIS — M9903 Segmental and somatic dysfunction of lumbar region: Secondary | ICD-10-CM | POA: Diagnosis not present

## 2024-02-08 DIAGNOSIS — M62830 Muscle spasm of back: Secondary | ICD-10-CM | POA: Diagnosis not present

## 2024-02-09 DIAGNOSIS — M62830 Muscle spasm of back: Secondary | ICD-10-CM | POA: Diagnosis not present

## 2024-02-09 DIAGNOSIS — M9904 Segmental and somatic dysfunction of sacral region: Secondary | ICD-10-CM | POA: Diagnosis not present

## 2024-02-09 DIAGNOSIS — M9903 Segmental and somatic dysfunction of lumbar region: Secondary | ICD-10-CM | POA: Diagnosis not present

## 2024-02-09 DIAGNOSIS — M9905 Segmental and somatic dysfunction of pelvic region: Secondary | ICD-10-CM | POA: Diagnosis not present

## 2024-02-13 DIAGNOSIS — M9904 Segmental and somatic dysfunction of sacral region: Secondary | ICD-10-CM | POA: Diagnosis not present

## 2024-02-13 DIAGNOSIS — M9905 Segmental and somatic dysfunction of pelvic region: Secondary | ICD-10-CM | POA: Diagnosis not present

## 2024-02-13 DIAGNOSIS — M62830 Muscle spasm of back: Secondary | ICD-10-CM | POA: Diagnosis not present

## 2024-02-13 DIAGNOSIS — M9903 Segmental and somatic dysfunction of lumbar region: Secondary | ICD-10-CM | POA: Diagnosis not present

## 2024-02-14 DIAGNOSIS — M9905 Segmental and somatic dysfunction of pelvic region: Secondary | ICD-10-CM | POA: Diagnosis not present

## 2024-02-14 DIAGNOSIS — M62830 Muscle spasm of back: Secondary | ICD-10-CM | POA: Diagnosis not present

## 2024-02-14 DIAGNOSIS — M9904 Segmental and somatic dysfunction of sacral region: Secondary | ICD-10-CM | POA: Diagnosis not present

## 2024-02-14 DIAGNOSIS — M9903 Segmental and somatic dysfunction of lumbar region: Secondary | ICD-10-CM | POA: Diagnosis not present

## 2024-02-16 DIAGNOSIS — M9903 Segmental and somatic dysfunction of lumbar region: Secondary | ICD-10-CM | POA: Diagnosis not present

## 2024-02-16 DIAGNOSIS — M9904 Segmental and somatic dysfunction of sacral region: Secondary | ICD-10-CM | POA: Diagnosis not present

## 2024-02-16 DIAGNOSIS — M62830 Muscle spasm of back: Secondary | ICD-10-CM | POA: Diagnosis not present

## 2024-02-16 DIAGNOSIS — M9905 Segmental and somatic dysfunction of pelvic region: Secondary | ICD-10-CM | POA: Diagnosis not present

## 2024-02-20 DIAGNOSIS — M9904 Segmental and somatic dysfunction of sacral region: Secondary | ICD-10-CM | POA: Diagnosis not present

## 2024-02-20 DIAGNOSIS — M62830 Muscle spasm of back: Secondary | ICD-10-CM | POA: Diagnosis not present

## 2024-02-20 DIAGNOSIS — M9903 Segmental and somatic dysfunction of lumbar region: Secondary | ICD-10-CM | POA: Diagnosis not present

## 2024-02-20 DIAGNOSIS — M9905 Segmental and somatic dysfunction of pelvic region: Secondary | ICD-10-CM | POA: Diagnosis not present

## 2024-02-21 DIAGNOSIS — M9905 Segmental and somatic dysfunction of pelvic region: Secondary | ICD-10-CM | POA: Diagnosis not present

## 2024-02-21 DIAGNOSIS — M9904 Segmental and somatic dysfunction of sacral region: Secondary | ICD-10-CM | POA: Diagnosis not present

## 2024-02-21 DIAGNOSIS — M62830 Muscle spasm of back: Secondary | ICD-10-CM | POA: Diagnosis not present

## 2024-02-21 DIAGNOSIS — M9903 Segmental and somatic dysfunction of lumbar region: Secondary | ICD-10-CM | POA: Diagnosis not present

## 2024-02-23 DIAGNOSIS — M9904 Segmental and somatic dysfunction of sacral region: Secondary | ICD-10-CM | POA: Diagnosis not present

## 2024-02-23 DIAGNOSIS — M62830 Muscle spasm of back: Secondary | ICD-10-CM | POA: Diagnosis not present

## 2024-02-23 DIAGNOSIS — M9903 Segmental and somatic dysfunction of lumbar region: Secondary | ICD-10-CM | POA: Diagnosis not present

## 2024-02-23 DIAGNOSIS — M9905 Segmental and somatic dysfunction of pelvic region: Secondary | ICD-10-CM | POA: Diagnosis not present

## 2024-03-05 DIAGNOSIS — R7301 Impaired fasting glucose: Secondary | ICD-10-CM | POA: Diagnosis not present

## 2024-03-05 DIAGNOSIS — I10 Essential (primary) hypertension: Secondary | ICD-10-CM | POA: Diagnosis not present

## 2024-03-05 DIAGNOSIS — G4733 Obstructive sleep apnea (adult) (pediatric): Secondary | ICD-10-CM | POA: Diagnosis not present

## 2024-03-05 DIAGNOSIS — Z0000 Encounter for general adult medical examination without abnormal findings: Secondary | ICD-10-CM | POA: Diagnosis not present

## 2024-03-05 DIAGNOSIS — J309 Allergic rhinitis, unspecified: Secondary | ICD-10-CM | POA: Diagnosis not present

## 2024-03-06 DIAGNOSIS — M9904 Segmental and somatic dysfunction of sacral region: Secondary | ICD-10-CM | POA: Diagnosis not present

## 2024-03-06 DIAGNOSIS — M62830 Muscle spasm of back: Secondary | ICD-10-CM | POA: Diagnosis not present

## 2024-03-06 DIAGNOSIS — M9905 Segmental and somatic dysfunction of pelvic region: Secondary | ICD-10-CM | POA: Diagnosis not present

## 2024-03-06 DIAGNOSIS — M9903 Segmental and somatic dysfunction of lumbar region: Secondary | ICD-10-CM | POA: Diagnosis not present

## 2024-03-07 DIAGNOSIS — M9903 Segmental and somatic dysfunction of lumbar region: Secondary | ICD-10-CM | POA: Diagnosis not present

## 2024-03-07 DIAGNOSIS — M62830 Muscle spasm of back: Secondary | ICD-10-CM | POA: Diagnosis not present

## 2024-03-07 DIAGNOSIS — M9904 Segmental and somatic dysfunction of sacral region: Secondary | ICD-10-CM | POA: Diagnosis not present

## 2024-03-07 DIAGNOSIS — M9905 Segmental and somatic dysfunction of pelvic region: Secondary | ICD-10-CM | POA: Diagnosis not present

## 2024-03-08 DIAGNOSIS — M9903 Segmental and somatic dysfunction of lumbar region: Secondary | ICD-10-CM | POA: Diagnosis not present

## 2024-03-08 DIAGNOSIS — M62830 Muscle spasm of back: Secondary | ICD-10-CM | POA: Diagnosis not present

## 2024-03-08 DIAGNOSIS — M9905 Segmental and somatic dysfunction of pelvic region: Secondary | ICD-10-CM | POA: Diagnosis not present

## 2024-03-08 DIAGNOSIS — M9904 Segmental and somatic dysfunction of sacral region: Secondary | ICD-10-CM | POA: Diagnosis not present

## 2024-03-22 DIAGNOSIS — M62830 Muscle spasm of back: Secondary | ICD-10-CM | POA: Diagnosis not present

## 2024-03-22 DIAGNOSIS — M9903 Segmental and somatic dysfunction of lumbar region: Secondary | ICD-10-CM | POA: Diagnosis not present

## 2024-03-22 DIAGNOSIS — M9904 Segmental and somatic dysfunction of sacral region: Secondary | ICD-10-CM | POA: Diagnosis not present

## 2024-03-22 DIAGNOSIS — M9905 Segmental and somatic dysfunction of pelvic region: Secondary | ICD-10-CM | POA: Diagnosis not present

## 2024-04-10 DIAGNOSIS — M9903 Segmental and somatic dysfunction of lumbar region: Secondary | ICD-10-CM | POA: Diagnosis not present

## 2024-04-10 DIAGNOSIS — M9905 Segmental and somatic dysfunction of pelvic region: Secondary | ICD-10-CM | POA: Diagnosis not present

## 2024-04-10 DIAGNOSIS — M9904 Segmental and somatic dysfunction of sacral region: Secondary | ICD-10-CM | POA: Diagnosis not present

## 2024-04-10 DIAGNOSIS — M62830 Muscle spasm of back: Secondary | ICD-10-CM | POA: Diagnosis not present

## 2024-04-24 DIAGNOSIS — M9905 Segmental and somatic dysfunction of pelvic region: Secondary | ICD-10-CM | POA: Diagnosis not present

## 2024-04-24 DIAGNOSIS — M9903 Segmental and somatic dysfunction of lumbar region: Secondary | ICD-10-CM | POA: Diagnosis not present

## 2024-04-24 DIAGNOSIS — M9904 Segmental and somatic dysfunction of sacral region: Secondary | ICD-10-CM | POA: Diagnosis not present

## 2024-04-24 DIAGNOSIS — M62830 Muscle spasm of back: Secondary | ICD-10-CM | POA: Diagnosis not present

## 2024-05-08 DIAGNOSIS — M9904 Segmental and somatic dysfunction of sacral region: Secondary | ICD-10-CM | POA: Diagnosis not present

## 2024-05-08 DIAGNOSIS — M62830 Muscle spasm of back: Secondary | ICD-10-CM | POA: Diagnosis not present

## 2024-05-08 DIAGNOSIS — M9905 Segmental and somatic dysfunction of pelvic region: Secondary | ICD-10-CM | POA: Diagnosis not present

## 2024-05-08 DIAGNOSIS — M9903 Segmental and somatic dysfunction of lumbar region: Secondary | ICD-10-CM | POA: Diagnosis not present

## 2024-05-21 DIAGNOSIS — G4733 Obstructive sleep apnea (adult) (pediatric): Secondary | ICD-10-CM | POA: Diagnosis not present
